# Patient Record
Sex: Female | Born: 1975 | Race: White | Hispanic: No | Marital: Married | Smoking: Former smoker
Health system: Southern US, Community
[De-identification: ages and names within clinical notes are randomized; demographics above are authoritative.]

## PROBLEM LIST (undated history)

## (undated) DIAGNOSIS — F419 Anxiety disorder, unspecified: Secondary | ICD-10-CM

## (undated) HISTORY — PX: KNEE ARTHROSCOPY WITH ANTERIOR CRUCIATE LIGAMENT (ACL) REPAIR: SHX5644

## (undated) HISTORY — DX: Anxiety disorder, unspecified: F41.9

## (undated) HISTORY — PX: FEMORAL HERNIA REPAIR: SUR1179

---

## 2012-07-29 ENCOUNTER — Telehealth: Payer: Self-pay | Admitting: *Deleted

## 2012-07-29 DIAGNOSIS — Z0000 Encounter for general adult medical examination without abnormal findings: Secondary | ICD-10-CM

## 2012-07-29 NOTE — Telephone Encounter (Signed)
LAB ORDERS ENTERED- APPT FOR LAB 5/1 AND MMM APPT 5/8

## 2012-08-05 ENCOUNTER — Other Ambulatory Visit (INDEPENDENT_AMBULATORY_CARE_PROVIDER_SITE_OTHER): Payer: BC Managed Care – PPO

## 2012-08-05 DIAGNOSIS — Z0000 Encounter for general adult medical examination without abnormal findings: Secondary | ICD-10-CM

## 2012-08-05 LAB — COMPLETE METABOLIC PANEL WITH GFR
BUN: 11 mg/dL (ref 6–23)
CO2: 25 mEq/L (ref 19–32)
Creat: 0.87 mg/dL (ref 0.50–1.10)
GFR, Est African American: >89 mL/min
GFR, Est Non African American: 85 mL/min
Glucose, Bld: 96 mg/dL (ref 70–99)
Total Bilirubin: 0.8 mg/dL (ref 0.3–1.2)

## 2012-08-05 NOTE — Addendum Note (Signed)
Addended by: Lisbeth Ply C on: 08/05/2012 10:36 AM   Modules accepted: Orders

## 2012-08-05 NOTE — Progress Notes (Signed)
Patient came in for labs only.

## 2012-08-06 LAB — NMR LIPOPROFILE WITH LIPIDS
Cholesterol, Total: 144 mg/dL (ref <200)
HDL Particle Number: 34.1 umol/L (ref >=30.5)
HDL Size: 9.4 nm (ref >=9.2)
HDL-C: 59 mg/dL (ref >=40)
LDL (calc): 75 mg/dL (ref <100)
LDL Particle Number: 797 nmol/L (ref <1000)
LDL Size: 21 nm (ref >20.5)
LP-IR Score: <25 (ref <=45)
Large HDL-P: 9.2 umol/L (ref >=4.8)
Large VLDL-P: <0.8 nmol/L (ref <=2.7)
Small LDL Particle Number: 228 nmol/L (ref <=527)
Triglycerides: 51 mg/dL (ref <150)
VLDL Size: 40.2 nm (ref <=46.6)

## 2012-08-10 ENCOUNTER — Ambulatory Visit (INDEPENDENT_AMBULATORY_CARE_PROVIDER_SITE_OTHER): Payer: BC Managed Care – PPO | Admitting: *Deleted

## 2012-08-10 DIAGNOSIS — Z111 Encounter for screening for respiratory tuberculosis: Secondary | ICD-10-CM

## 2012-08-12 ENCOUNTER — Encounter: Payer: Self-pay | Admitting: Nurse Practitioner

## 2012-08-12 ENCOUNTER — Ambulatory Visit (INDEPENDENT_AMBULATORY_CARE_PROVIDER_SITE_OTHER): Payer: BC Managed Care – PPO | Admitting: Nurse Practitioner

## 2012-08-12 VITALS — BP 118/74 | HR 65 | Temp 97.5°F | Ht 66.0 in | Wt 143.0 lb

## 2012-08-12 DIAGNOSIS — Z0000 Encounter for general adult medical examination without abnormal findings: Secondary | ICD-10-CM

## 2012-08-12 NOTE — Patient Instructions (Signed)

## 2012-08-12 NOTE — Progress Notes (Signed)
  Subjective:    Patient ID: Marie Baxter, female    DOB: [birth year], 37 y.o.   MRN: [id number]  HPI - Patient here today for CPE for work. She is doing well. Has no medical problems and takes no medicines.    Review of Systems  Constitutional: Negative.   HENT: Negative.   Eyes: Negative.   Respiratory: Negative.   Cardiovascular: Negative.   Gastrointestinal: Negative.   Endocrine: Negative.   Genitourinary: Negative.   Musculoskeletal: Negative.   Allergic/Immunologic: Negative.   Neurological: Negative.   Hematological: Negative.   Psychiatric/Behavioral: Negative.        Objective:   Physical Exam  Constitutional: She is oriented to person, place, and time. She appears well-developed and well-nourished.  HENT:  Nose: Nose normal.  Mouth/Throat: Oropharynx is clear and moist.  Eyes: EOM are normal.  Neck: Trachea normal, normal range of motion and full passive range of motion without pain. Neck supple. No JVD present. Carotid bruit is not present. No thyromegaly present.  Cardiovascular: Normal rate, regular rhythm, normal heart sounds and intact distal pulses.  Exam reveals no gallop and no friction rub.   No murmur heard. Pulmonary/Chest: Effort normal and breath sounds normal.  Abdominal: Soft. Bowel sounds are normal. She exhibits no distension and no mass. There is no tenderness.  Musculoskeletal: Normal range of motion.  Lymphadenopathy:    She has no cervical adenopathy.  Neurological: She is alert and oriented to person, place, and time. She has normal reflexes.  Skin: Skin is warm and dry.  Psychiatric: She has a normal mood and affect. Her behavior is normal. Judgment and thought content normal.          Assessment & Plan:

## 2017-07-21 ENCOUNTER — Ambulatory Visit: Payer: Medicaid Other | Admitting: Family Medicine

## 2017-07-21 ENCOUNTER — Encounter: Payer: Self-pay | Admitting: Family Medicine

## 2017-07-21 VITALS — BP 99/58 | HR 58 | Temp 97.9°F | Ht 66.0 in | Wt 148.0 lb

## 2017-07-21 DIAGNOSIS — Z13220 Encounter for screening for lipoid disorders: Secondary | ICD-10-CM

## 2017-07-21 DIAGNOSIS — M19079 Primary osteoarthritis, unspecified ankle and foot: Secondary | ICD-10-CM

## 2017-07-21 DIAGNOSIS — Z13228 Encounter for screening for other metabolic disorders: Secondary | ICD-10-CM

## 2017-07-21 DIAGNOSIS — Z7689 Persons encountering health services in other specified circumstances: Secondary | ICD-10-CM | POA: Diagnosis not present

## 2017-07-21 DIAGNOSIS — Z130 Encounter for screening for diseases of the blood and blood-forming organs and certain disorders involving the immune mechanism: Secondary | ICD-10-CM

## 2017-07-21 DIAGNOSIS — F419 Anxiety disorder, unspecified: Secondary | ICD-10-CM

## 2017-07-21 NOTE — Progress Notes (Signed)
Subjective: AJ:OINOMVEHM care, joint pain HPI: Marie Baxter is a 42 y.o. female presenting to clinic today for:  1. Joint pain Patient reports that she has been having some pain in the great toe joint of the right foot.  She was wondering if perhaps this was related to poor kidney function and may be was gout.  Denies any increased warmth, numbness or tingling.  No preceding injury that she can recall but she has dropped objects on her feet before.  She has been using ibuprofen for any discomfort.  This has resulted in moderate relief of joint pain.  She notes that she sometimes "drinks more than she should".  Sometimes, she reports that she will drink about 6 beers on the weekend and knows that this is not good for her.   2.  Anxiety Patient reports a history of anxiety, diagnosed about 3 years ago.  She states that much of it started when her mother moved into the home next to her.  She is currently prescribed Lexapro 10 mg, which she takes daily.  Lexapro was prescribed by her OB/GYN.  Tolerating medication without difficulty.  No concerns at this time.  3.  Preventative care Patient notes that she has never had a mammogram before.  There is a family history of breast cancer in her maternal grandmother and her maternal aunt, who she thinks was diagnosed around age 62.  She also notes that her mother had some type of gynecologic surgery for an unknown gynecologic cancer.  Patient reports a personal history of having her bilateral fallopian tubes removed to reduce her risk of developing cancer.  She notes that she gets her Pap smears with Dr. Adah Perl, her OB/GYN.  She has an appointment scheduled next month to have this done.  She has had a test for HIV in the past and this was negative.  She also had her tetanus shot during pregnancy.  She will have these records sent to our office.  Past Medical History:  Diagnosis Date  . Anxiety    Past Surgical History:  Procedure Laterality Date    . CESAREAN SECTION  2011  . CESAREAN SECTION  2015  . FEMORAL HERNIA REPAIR Left   . KNEE ARTHROSCOPY WITH ANTERIOR CRUCIATE LIGAMENT (ACL) REPAIR     Social History   Socioeconomic History  . Marital status: Married    Spouse name: Not on file  . Number of children: 2  . Years of education: Not on file  . Highest education level: Not on file  Occupational History  . 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  Social Needs  . Financial resource strain: Not on file  . Food insecurity:    Worry: Not on file    Inability: Not on file  . Transportation needs:    Medical: Not on file    Non-medical: Not on file  Tobacco Use  . Smoking status: Former Smoker    Types: Cigarettes  . Smokeless tobacco: Never Used  Substance and Sexual Activity  . Alcohol use: Yes    Alcohol/week: 3.6 oz    Types: 6 Cans of beer per week    Comment: maybe twice per month  . Drug use: No  . Sexual activity: Yes    Birth control/protection: Surgical  Lifestyle  . Physical activity:    Days per week: Not on file    Minutes per session: Not on file  . Stress: Not on file  Relationships  . Social connections:    Talks on phone: Not on file    Gets together: Not on file    Attends religious service: Not on file    Active member of club or organization: Not on file    Attends meetings of clubs or organizations: Not on file    Relationship status: Not on file  . Intimate partner violence:    Fear of current or ex partner: Not on file    Emotionally abused: Not on file    Physically abused: Not on file    Forced sexual activity: Not on file  Other Topics Concern  . Not on file  Social History Narrative  . Not on file   Current Meds  Medication Sig  . escitalopram (LEXAPRO) 20 MG tablet Take 10 mg by mouth daily.   Family History  Problem Relation Age of Onset  . COPD Mother   . Cancer Mother        unknown GYN cancer  . Stroke Mother 78  . Cancer Maternal Aunt         breast cancer  . Breast cancer Maternal Aunt   . Cancer Maternal Grandmother        breast cancer  . COPD Maternal Grandmother   . Breast cancer Maternal Grandmother   . Cancer Father        oral  . Alcohol abuse Father   . Healthy Sister   . Healthy Son   . Healthy Sister   . Allergic rhinitis Son    No Known Allergies   Health Maintenance: Pap/ Mammo ROS: Per HPI  Objective: Office vital signs reviewed. BP (!) 99/58   Pulse (!) 58   Temp 97.9 F (36.6 C) (Oral)   Ht _0  (1.676 m)   Wt 148 lb (67.1 kg)   BMI 23.89 kg/m   Physical Examination:  General: Awake, alert, well nourished, well appearing female, No acute distress HEENT: Normal    Neck: No masses palpated. No lymphadenopathy    Ears: Tympanic membranes intact, normal light reflex, no erythema, no bulging    Eyes: PERRLA, extraocular movement in tact, sclera white    Nose: nasal turbinates moist, no nasal discharge    Throat: moist mucus membranes, no erythema, no tonsillar exudate.  Airway is patent Cardio: regular rate and rhythm, S1S2 heard, no murmurs appreciated Pulm: clear to auscultation bilaterally, no wheezes, rhonchi or rales; normal work of breathing on room air GI: soft, non-tender, non-distended, bowel sounds present x4, no hepatomegaly, no splenomegaly, no masses Extremities: warm, well perfused, No edema, cyanosis or clubbing; +2 pulses bilaterally  Right great toe: No gross joint swelling or erythema.  No increased warmth.  Patient has full active range of motion of the great toe.  There is no tenderness to palpation.  She does seem to have somewhat of an enlarged joint in this area. MSK: normal gait and normal station Skin: dry; intact; no rashes or lesions Neuro: No focal neurologic deficits.  Follows all commands  Psych: Mood stable, speech normal, affect appropriate, good eye contact Depression screen PHQ 2/9 07/21/2017  Decreased Interest 0  Down, Depressed, Hopeless 0  PHQ - 2  Score 0    Assessment/ Plan: 42 y.o. female   1. Arthritis of metatarsophalangeal (MTP) joint of great toe No evidence of gout flare or infectious arthritis.  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  May continue oral NSAID versus Tylenol arthritis.  If continues  to be an issue, we will plan to obtain x-rays of the foot and possibly refer to orthopedics for intra-articular injections.  For now okay to continue conservative care.  2. Establishing care with new doctor, encounter for I asked that patient have Dr. Adah Perl send [place] office notes with her Pap smear and mammogram results.  I am happy to take over prescribing Lexapro if patient wishes.  2. Screening for metabolic disorder - UNG76+BOMQ  3. Screening for lipid disorders - Lipid Panel  4. Screening for deficiency anemia - CBC with Differential  5. Anxiety Currently controlled with Lexapro 10 mg.  We will continue to monitor and assist as able.  Janora Norlander, DO [place] [phone]

## 2017-07-22 LAB — LIPID PANEL
CHOLESTEROL TOTAL: 177 mg/dL (ref 100–199)
Chol/HDL Ratio: 2.9 ratio (ref 0.0–4.4)
HDL: 62 mg/dL (ref >39)
LDL Calculated: 101 mg/dL — ABNORMAL HIGH (ref 0–99)
TRIGLYCERIDES: 72 mg/dL (ref 0–149)
VLDL Cholesterol Cal: 14 mg/dL (ref 5–40)

## 2017-07-22 LAB — CBC WITH DIFFERENTIAL/PLATELET
BASOS: 2 %
Basophils Absolute: 0.1 10*3/uL (ref 0.0–0.2)
EOS (ABSOLUTE): 0.1 10*3/uL (ref 0.0–0.4)
EOS: 3 %
HEMATOCRIT: 39.9 % (ref 34.0–46.6)
Hemoglobin: 13.2 g/dL (ref 11.1–15.9)
IMMATURE GRANS (ABS): 0 10*3/uL (ref 0.0–0.1)
IMMATURE GRANULOCYTES: 0 %
LYMPHS: 44 %
Lymphocytes Absolute: 1.8 10*3/uL (ref 0.7–3.1)
MCH: 32.8 pg (ref 26.6–33.0)
MCHC: 33.1 g/dL (ref 31.5–35.7)
MCV: 99 fL — AB (ref 79–97)
MONOS ABS: 0.3 10*3/uL (ref 0.1–0.9)
Monocytes: 9 %
NEUTROS PCT: 42 %
Neutrophils Absolute: 1.7 10*3/uL (ref 1.4–7.0)
Platelets: 222 10*3/uL (ref 150–379)
RBC: 4.03 x10E6/uL (ref 3.77–5.28)
RDW: 13.4 % (ref 12.3–15.4)
WBC: 4 10*3/uL (ref 3.4–10.8)

## 2017-07-22 LAB — CMP14+EGFR
ALK PHOS: 45 IU/L (ref 39–117)
ALT: 17 IU/L (ref 0–32)
AST: 20 IU/L (ref 0–40)
Albumin/Globulin Ratio: 2 (ref 1.2–2.2)
Albumin: 4.9 g/dL (ref 3.5–5.5)
BUN/Creatinine Ratio: 10 (ref 9–23)
BUN: 8 mg/dL (ref 6–24)
Bilirubin Total: 0.5 mg/dL (ref 0.0–1.2)
CO2: 26 mmol/L (ref 20–29)
CREATININE: 0.81 mg/dL (ref 0.57–1.00)
Calcium: 9.1 mg/dL (ref 8.7–10.2)
Chloride: 102 mmol/L (ref 96–106)
GFR calc Af Amer: 103 mL/min/{1.73_m2} (ref >59)
GFR calc non Af Amer: 89 mL/min/{1.73_m2} (ref >59)
GLUCOSE: 69 mg/dL (ref 65–99)
Globulin, Total: 2.4 g/dL (ref 1.5–4.5)
Potassium: 4.1 mmol/L (ref 3.5–5.2)
SODIUM: 141 mmol/L (ref 134–144)
Total Protein: 7.3 g/dL (ref 6.0–8.5)

## 2018-07-28 ENCOUNTER — Encounter: Payer: Self-pay | Admitting: Family Medicine

## 2018-07-28 ENCOUNTER — Other Ambulatory Visit: Payer: Self-pay

## 2018-07-28 ENCOUNTER — Ambulatory Visit: Payer: PRIVATE HEALTH INSURANCE | Admitting: Family Medicine

## 2018-07-28 VITALS — BP 120/74 | HR 70 | Temp 98.9°F | Ht 66.0 in | Wt 158.0 lb

## 2018-07-28 DIAGNOSIS — R635 Abnormal weight gain: Secondary | ICD-10-CM

## 2018-07-28 DIAGNOSIS — F419 Anxiety disorder, unspecified: Secondary | ICD-10-CM

## 2018-07-28 DIAGNOSIS — M19079 Primary osteoarthritis, unspecified ankle and foot: Secondary | ICD-10-CM

## 2018-07-28 MED ORDER — ESCITALOPRAM OXALATE 20 MG PO TABS: 10.0000 mg | ORAL_TABLET | Freq: Every day | ORAL | 1 refills | Status: DC

## 2018-07-28 NOTE — Progress Notes (Signed)
 Subjective: CC: GAD, ?gout PCP: Gottschalk, Ashly M, DO HPI:Marie Baxter is a 43 y.o. female presenting to clinic today for:  1. GAD History: Longstanding history of generalized anxiety disorder.  She was previously treated with Lexapro 10 mg daily by her gynecologist.  She is asking that I take over prescriptions for this medicine.  She has been off of it for a while now but notes that anxiety symptoms have come back.  She would like to resume medication.  Additionally, she notes that she was on 20 mg in the past but this induced lethargy.  No SI, HI.  2.  Pain of right great toe joint Patient with known MTP arthritis of the great toe on the right foot.  She notes that the pain seemed to worsen after consumption of alcohol recently and she wanted to know she needed to be checked out for gout.  Overall the pain in her joint is somewhat relieved by scheduled Tylenol arthritis but occasionally she does have worsening symptoms.  Denies any other joint pain or swelling.  No discoloration.  No increased warmth of the great toe.  3.  Weight gain Patient reports over the last month or so she has gained about 4 pounds every 2 weeks.  She wonders if this is because of being cooped up in the house secondary to COVID-19.  She would like to have her thyroid checked.  No tremor, change in voice.   ROS: Per HPI  No Known Allergies Past Medical History:  Diagnosis Date  . Anxiety     Current Outpatient Medications:  .  escitalopram (LEXAPRO) 20 MG tablet, Take 0.5 tablets (10 mg total) by mouth daily., Disp: 90 tablet, Rfl: 1 Social History   Socioeconomic History  . Marital status: Married    Spouse name: Not on file  . Number of children: 2  . Years of education: Not on file  . Highest education level: Not on file  Occupational History  . 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  Social Needs  . Financial resource strain: Not on file  . Food insecurity:     Worry: Not on file    Inability: Not on file  . Transportation needs:    Medical: Not on file    Non-medical: Not on file  Tobacco Use  . Smoking status: Former Smoker    Types: Cigarettes  . Smokeless tobacco: Never Used  Substance and Sexual Activity  . Alcohol use: Yes    Alcohol/week: 6.0 standard drinks    Types: 6 Cans of beer per week    Comment: maybe twice per month  . Drug use: No  . Sexual activity: Yes    Birth control/protection: Surgical  Lifestyle  . Physical activity:    Days per week: Not on file    Minutes per session: Not on file  . Stress: Not on file  Relationships  . Social connections:    Talks on phone: Not on file    Gets together: Not on file    Attends religious service: Not on file    Active member of club or organization: Not on file    Attends meetings of clubs or organizations: Not on file    Relationship status: Not on file  . Intimate partner violence:    Fear of current or ex partner: Not on file    Emotionally abused: Not on file    Physically abused: Not on file      Forced sexual activity: Not on file  Other Topics Concern  . Not on file  Social History Narrative  . Not on file   Family History  Problem Relation Age of Onset  . COPD Mother   . Cancer Mother        unknown GYN cancer  . Stroke Mother 35  . Cancer Maternal Aunt        breast cancer  . Breast cancer Maternal Aunt   . Cancer Maternal Grandmother        breast cancer  . COPD Maternal Grandmother   . Breast cancer Maternal Grandmother   . Cancer Father        oral  . Alcohol abuse Father   . Healthy Sister   . Healthy Son   . Healthy Sister   . Allergic rhinitis Son     Objective: Office vital signs reviewed. BP 120/74   Pulse 70   Temp 98.9 F (37.2 C) (Oral)   Ht 5' 6" (1.676 m)   Wt 158 lb (71.7 kg)   BMI 25.50 kg/m   Physical Examination:  General: Awake, alert, well nourished, No acute distress HEENT: Normal    Neck: No masses palpated. No  lymphadenopathy; no thyromegaly or palpable thyroid nodules    Ears: Tympanic membranes intact, normal light reflex, no erythema, no bulging    Eyes: No exophthalmos, sclera white Cardio: regular rate and rhythm, S1S2 heard, no murmurs appreciated Pulm: clear to auscultation bilaterally, no wheezes, rhonchi or rales; normal work of breathing on room air Extremities: warm, well perfused, No edema, cyanosis or clubbing; +2 pulses bilaterally MSK: Normal gait and normal station  Right foot: She has visible arthritic changes, particular along the medial aspect of the right MTP.  There is no appreciable increased warmth, discoloration or joint swelling. Skin: dry; intact; no rashes or lesions; normal temperature Neuro: Patellar DTRs 3/4 bilaterally Psych: Mood stable, speech normal, affect appropriate, pleasant and interactive. Depression screen [facility] 2/9 07/28/2018 07/21/2017  Decreased Interest 1 0  Down, Depressed, Hopeless 0 0  PHQ - 2 Score 1 0  Altered sleeping 1 -  Tired, decreased energy 1 -  Change in appetite 1 -  Feeling bad or failure about yourself  2 -  Trouble concentrating 1 -  Moving slowly or fidgety/restless 1 -  Suicidal thoughts 0 -  PHQ-9 Score 8 -  Difficult doing work/chores Somewhat difficult -   GAD 7 : Generalized Anxiety Score 07/28/2018  Nervous, Anxious, on Edge 2  Control/stop worrying 2  Worry too much - different things 2  Trouble relaxing 2  Restless 2  Easily annoyed or irritable 3  Afraid - awful might happen 1  Total GAD 7 Score 14   Assessment/ Plan: 43 y.o. female   1. Anxiety Likely exacerbated by the fact that she has been off of medications.  I will start her back on the Lexapro 10 mg daily.  She prefers a 20 mg tablet so that she can break them in half and have a longer supply.  We discussed that if anxiety continue to be uncontrolled we can consider adding something like buspirone.  Would hesitate to add something like Atarax or a  benzodiazepine in this patient who had lethargy related to increased dose of Lexapro.  Check thyroid panel, CMP. - escitalopram (LEXAPRO) 20 MG tablet; Take 0.5 tablets (10 mg total) by mouth daily.  Dispense: 90 tablet; Refill: 1 - CMP14+EGFR - TSH  2. Arthritis of metatarsophalangeal (  MTP) joint of great toe Doubt gout at this point but given the fact that symptoms seem worse with alcohol we will check uric acid.  Check CBC and CMP.  I have given her handout on low purine diet for ongoing reference - CMP14+EGFR - CBC - Uric Acid  3. Unexplained weight gain Likely related to the state home order.  We will check thyroid panel to rule out metabolic etiology.  I encouraged her to maintain a healthy diet and stay physically active.  Lipid panel is nonfasting. - CMP14+EGFR - TSH - Lipid Panel   Orders Placed This Encounter  Procedures  . CMP14+EGFR  . CBC  . TSH  . Lipid Panel  . Uric Acid   Meds ordered this encounter  Medications  . escitalopram (LEXAPRO) 20 MG tablet    Sig: Take 0.5 tablets (10 mg total) by mouth daily.    Dispense:  90 tablet    Refill:  1     Ashly M Gottschalk, DO [facility] [phone]   

## 2018-07-28 NOTE — Patient Instructions (Signed)
You had labs performed today.  You will be contacted with the results of the labs once they are available, usually in the next 3 business days for routine lab work.  If you had a pap smear or biopsy performed, expect to be contacted in about 7-10 days.  We discussed using Naproxen for pain (you can take 2 over the counter at a time if needed.  Take with food and SEPARATELY from your lexapro.  We discussed medications that we can add to Lexapro if needed for ongoing anxiety.  Give yourself about 6-8 weeks on Lexapro before we decide if additional medication is needed.  Buspar is the medication we would add next.  Make sure to have your pap smear results sent to me.   Low-Purine Eating Plan A low-purine eating plan involves making food choices to limit your intake of purine. Purine is a kind of uric acid. Too much uric acid in your blood can cause certain conditions, such as gout and kidney stones. Eating a low-purine diet can help control these conditions. What are tips for following this plan? Reading food labels   Avoid foods with saturated or Trans fat.  Check the ingredient list of grains-based foods, such as bread and cereal, to make sure that they contain whole grains.  Check the ingredient list of sauces or soups to make sure they do not contain meat or fish.  When choosing soft drinks, check the ingredient list to make sure they do not contain high-fructose corn syrup. Shopping  Buy plenty of fresh fruits and vegetables.  Avoid buying canned or fresh fish.  Buy dairy products labeled as low-fat or nonfat.  Avoid buying premade or processed foods. These foods are often high in fat, salt (sodium), and added sugar. Cooking  Use olive oil instead of butter when cooking. Oils like olive oil, canola oil, and sunflower oil contain healthy fats. Meal planning  Learn which foods do or do not affect you. If you find out that a food tends to cause your gout symptoms to flare up,  avoid eating that food. You can enjoy foods that do not cause problems. If you have any questions about a food item, talk with your dietitian or health care provider.  Limit foods high in fat, especially saturated fat. Fat makes it harder for your body to get rid of uric acid.  Choose foods that are lower in fat and are lean sources of protein. General guidelines  Limit alcohol intake to no more than 1 drink a day for nonpregnant women and 2 drinks a day for men. One drink equals 12 oz of beer, 5 oz of wine, or 1 oz of hard liquor. Alcohol can affect the way your body gets rid of uric acid.  Drink plenty of water to keep your urine clear or pale yellow. Fluids can help remove uric acid from your body.  If directed by your health care provider, take a vitamin C supplement.  Work with your health care provider and dietitian to develop a plan to achieve or maintain a healthy weight. Losing weight can help reduce uric acid in your blood. What foods are recommended? The items listed may not be a complete list. Talk with your dietitian about what dietary choices are best for you. Foods low in purines Foods low in purines do not need to be limited. These include:  All fruits.  All low-purine vegetables, pickles, and olives.  Breads, pasta, rice, cornbread, and popcorn. Cake and other baked  goods.  All dairy foods.  Eggs, nuts, and nut butters.  Spices and condiments, such as salt, herbs, and vinegar.  Plant oils, butter, and margarine.  Water, sugar-free soft drinks, tea, coffee, and cocoa.  Vegetable-based soups, broths, sauces, and gravies. Foods moderate in purines Foods moderate in purines should be limited to the amounts listed.   cup of asparagus, cauliflower, spinach, mushrooms, or green peas, each day.  2/3 cup uncooked oatmeal, each day.   cup dry wheat bran or wheat germ, each day.  2-3 ounces of meat or poultry, each day.  4-6 ounces of shellfish, such as crab,  lobster, oysters, or shrimp, each day.  1 cup cooked beans, peas, or lentils, each day.  Soup, broths, or bouillon made from meat or fish. Limit these foods as much as possible. What foods are not recommended? The items listed may not be a complete list. Talk with your dietitian about what dietary choices are best for you. Limit your intake of foods high in purines, including:  Beer and other alcohol.  Meat-based gravy or sauce.  Canned or fresh fish, such as: ? Anchovies, sardines, herring, and tuna. ? Mussels and scallops. ? Codfish, trout, and haddock.  Berniece Salines.  Organ meats, such as: ? Liver or kidney. ? Tripe. ? Sweetbreads (thymus gland or pancreas).  Wild Clinical biochemist.  Yeast or yeast extract supplements.  Drinks sweetened with high-fructose corn syrup. Summary  Eating a low-purine diet can help control conditions caused by too much uric acid in the body, such as gout or kidney stones.  Choose low-purine foods, limit alcohol, and limit foods high in fat.  You will learn over time which foods do or do not affect you. If you find out that a food tends to cause your gout symptoms to flare up, avoid eating that food. This information is not intended to replace advice given to you by your health care provider. Make sure you discuss any questions you have with your health care provider. Document Released: 07/19/2010 Document Revised: 05/07/2016 Document Reviewed: 05/07/2016 Elsevier Interactive Patient Education  2019 [employer].

## 2018-07-29 LAB — CBC
Hematocrit: 38.3 % (ref 34.0–46.6)
Hemoglobin: 13 g/dL (ref 11.1–15.9)
MCH: 32.8 pg (ref 26.6–33.0)
MCHC: 33.9 g/dL (ref 31.5–35.7)
MCV: 97 fL (ref 79–97)
Platelets: 207 10*3/uL (ref 150–450)
RBC: 3.96 x10E6/uL (ref 3.77–5.28)
RDW: 12.1 % (ref 11.7–15.4)
WBC: 4.5 10*3/uL (ref 3.4–10.8)

## 2018-07-29 LAB — CMP14+EGFR
ALT: 14 IU/L (ref 0–32)
AST: 18 IU/L (ref 0–40)
Albumin/Globulin Ratio: 2 (ref 1.2–2.2)
Albumin: 4.5 g/dL (ref 3.8–4.8)
Alkaline Phosphatase: 46 IU/L (ref 39–117)
BUN/Creatinine Ratio: 13 (ref 9–23)
BUN: 10 mg/dL (ref 6–24)
Bilirubin Total: 0.6 mg/dL (ref 0.0–1.2)
CO2: 25 mmol/L (ref 20–29)
Calcium: 9.2 mg/dL (ref 8.7–10.2)
Chloride: 101 mmol/L (ref 96–106)
Creatinine, Ser: 0.76 mg/dL (ref 0.57–1.00)
GFR calc Af Amer: 110 mL/min/{1.73_m2} (ref >59)
GFR calc non Af Amer: 96 mL/min/{1.73_m2} (ref >59)
Globulin, Total: 2.2 g/dL (ref 1.5–4.5)
Glucose: 71 mg/dL (ref 65–99)
Potassium: 4.6 mmol/L (ref 3.5–5.2)
Sodium: 138 mmol/L (ref 134–144)
Total Protein: 6.7 g/dL (ref 6.0–8.5)

## 2018-07-29 LAB — LIPID PANEL
Chol/HDL Ratio: 3.6 ratio (ref 0.0–4.4)
Cholesterol, Total: 218 mg/dL — ABNORMAL HIGH (ref 100–199)
HDL: 61 mg/dL (ref >39)
LDL Calculated: 130 mg/dL — ABNORMAL HIGH (ref 0–99)
Triglycerides: 136 mg/dL (ref 0–149)
VLDL Cholesterol Cal: 27 mg/dL (ref 5–40)

## 2018-07-29 LAB — TSH: TSH: 1.23 u[IU]/mL (ref 0.450–4.500)

## 2018-07-29 LAB — URIC ACID: Uric Acid: 3.1 mg/dL (ref 2.5–7.1)

## 2019-07-21 ENCOUNTER — Other Ambulatory Visit: Payer: Self-pay | Admitting: Family Medicine

## 2019-07-21 DIAGNOSIS — F419 Anxiety disorder, unspecified: Secondary | ICD-10-CM

## 2019-08-09 ENCOUNTER — Other Ambulatory Visit: Payer: Self-pay | Admitting: Family Medicine

## 2019-08-09 DIAGNOSIS — F419 Anxiety disorder, unspecified: Secondary | ICD-10-CM

## 2019-08-10 NOTE — Telephone Encounter (Signed)
Gottschalk. NTBS 30 days given 07/21/19 LOV 07/28/18

## 2019-08-11 MED ORDER — ESCITALOPRAM OXALATE 20 MG PO TABS: 20.0000 mg | ORAL_TABLET | Freq: Every day | ORAL | 0 refills | Status: DC

## 2019-08-11 NOTE — Telephone Encounter (Signed)
Patient made fist available with provider.

## 2019-08-11 NOTE — Addendum Note (Signed)
Addended by: Brynda Peon F on: 08/11/2019 10:46 AM   Modules accepted: Orders

## 2019-09-12 ENCOUNTER — Ambulatory Visit: Payer: Self-pay | Attending: Internal Medicine

## 2019-09-12 ENCOUNTER — Ambulatory Visit: Payer: PRIVATE HEALTH INSURANCE | Admitting: Family Medicine

## 2019-09-12 DIAGNOSIS — Z23 Encounter for immunization: Secondary | ICD-10-CM

## 2019-09-12 NOTE — Progress Notes (Signed)
   Covid-19 Vaccination Clinic  Name:  Marie Baxter    MRN: [id number] DOB: [birth year]  09/12/2019  Marie Baxter was observed post Covid-19 immunization for 15 minutes without incident. She was provided with Vaccine Information Sheet and instruction to access the V-Safe system.   Marie Baxter was instructed to call 911 with any severe reactions post vaccine: Marland Kitchen Difficulty breathing  . Swelling of face and throat  . A fast heartbeat  . A bad rash all over body  . Dizziness and weakness   Immunizations Administered    Name Date Dose VIS Date Route   JANSSEN COVID-19 VACCINE 09/12/2019  3:15 PM 0.5 mL 06/04/2019 Intramuscular   Manufacturer: Alphonsa Overall   Lot: [id number]   [place]: [id number]

## 2019-10-04 ENCOUNTER — Other Ambulatory Visit: Payer: Self-pay

## 2019-10-04 ENCOUNTER — Encounter: Payer: Self-pay | Admitting: Family Medicine

## 2019-10-04 ENCOUNTER — Ambulatory Visit (INDEPENDENT_AMBULATORY_CARE_PROVIDER_SITE_OTHER): Payer: Self-pay | Admitting: Family Medicine

## 2019-10-04 VITALS — BP 135/94 | HR 75 | Temp 97.9°F | Ht 66.0 in | Wt 152.0 lb

## 2019-10-04 DIAGNOSIS — Z638 Other specified problems related to primary support group: Secondary | ICD-10-CM

## 2019-10-04 DIAGNOSIS — F419 Anxiety disorder, unspecified: Secondary | ICD-10-CM

## 2019-10-04 MED ORDER — ESCITALOPRAM OXALATE 20 MG PO TABS: 20.0000 mg | ORAL_TABLET | Freq: Every day | ORAL | 3 refills | Status: DC

## 2019-10-04 NOTE — Progress Notes (Signed)
Subjective: CC: GAD follow up PCP: Marie Norlander, DO Marie Baxter is a 44 y.o. female presenting to clinic today for:  1. GAD History: Longstanding history of generalized anxiety disorder.  She was previously treated with Lexapro 10 mg daily by her gynecologist.    She has that she been fairly stable on the Lexapro 20 mg but that she ran out about 2 weeks ago and recently went on an obligatory vacation with her in-laws.  There was some poor treatment of her son and this caused strain with her in-laws.  She is still quite agitated about the events that occurred at the beach.  She attributes much of her moodiness to the lapse in medication and would certainly like to go back on the medication.  She does not report any adverse side effects of the medicine.  ROS: Per HPI  No Known Allergies Past Medical History:  Diagnosis Date  . Anxiety     Current Outpatient Medications:  .  escitalopram (LEXAPRO) 20 MG tablet, Take 1 tablet (20 mg total) by mouth daily. (Needs to be seen before next refill), Disp: 30 tablet, Rfl: 0 Social History   Socioeconomic History  . Marital status: Married    Spouse name: Not on file  . Number of children: 2  . Years of education: Not on file  . Highest education level: Not on file  Occupational History  . 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  Tobacco Use  . Smoking status: Former Smoker    Types: Cigarettes  . Smokeless tobacco: Never Used  Vaping Use  . Vaping Use: Never used  Substance and Sexual Activity  . Alcohol use: Yes    Alcohol/week: 6.0 standard drinks    Types: 6 Cans of beer per week    Comment: maybe twice per month  . Drug use: No  . Sexual activity: Yes    Birth control/protection: Surgical  Other Topics Concern  . Not on file  Social History Narrative  . Not on file   Social Determinants of Health   Financial Resource Strain:   . Difficulty of Paying Living Expenses:   Food  Insecurity:   . Worried About Charity fundraiser in the Last Year:   . Arboriculturist in the Last Year:   Transportation Needs:   . Film/video editor (Medical):   Marland Kitchen Lack of Transportation (Non-Medical):   Physical Activity:   . Days of Exercise per Week:   . Minutes of Exercise per Session:   Stress:   . Feeling of Stress :   Social Connections:   . Frequency of Communication with Friends and Family:   . Frequency of Social Gatherings with Friends and Family:   . Attends Religious Services:   . Active Member of Clubs or Organizations:   . Attends Archivist Meetings:   Marland Kitchen Marital Status:   Intimate Partner Violence:   . Fear of Current or Ex-Partner:   . Emotionally Abused:   Marland Kitchen Physically Abused:   . Sexually Abused:    Family History  Problem Relation Age of Onset  . COPD Mother   . Cancer Mother        unknown GYN cancer  . Stroke Mother 15  . Cancer Maternal Aunt        breast cancer  . Breast cancer Maternal Aunt   . Cancer Maternal Grandmother        breast  cancer  . COPD Maternal Grandmother   . Breast cancer Maternal Grandmother   . Cancer Father        oral  . Alcohol abuse Father   . Healthy Sister   . Healthy Son   . Healthy Sister   . Allergic rhinitis Son     Objective: Office vital signs reviewed. BP (!) 135/94   Pulse 75   Temp 97.9 F (36.6 C)   Ht 5\' 6"  (1.676 m)   Wt 152 lb (68.9 kg)   SpO2 98%   BMI 24.53 kg/m   Physical Examination:  General: Awake, alert, well nourished, No acute distress HEENT: Normal; no LAD Cardio: regular rate and rhythm, S1S2 heard, no murmurs appreciated Pulm: clear to auscultation bilaterally, no wheezes, rhonchi or rales; normal work of breathing on room air Psych: Mood somewhat agitated but patient is pleasant, speech normal, affect appropriate Depression screen [facility] 2/9 10/04/2019 07/28/2018 07/21/2017  Decreased Interest 0 1 0  Down, Depressed, Hopeless 0 0 0  PHQ - 2 Score 0 1 0  Altered  sleeping 0 1 -  Tired, decreased energy 0 1 -  Change in appetite 0 1 -  Feeling bad or failure about yourself  0 2 -  Trouble concentrating 0 1 -  Moving slowly or fidgety/restless 0 1 -  Suicidal thoughts 0 0 -  PHQ-9 Score 0 8 -  Difficult doing work/chores - Somewhat difficult -   GAD 7 : Generalized Anxiety Score 10/04/2019 07/28/2018  Nervous, Anxious, on Edge 2 2  Control/stop worrying 1 2  Worry too much - different things 1 2  Trouble relaxing 1 2  Restless 2 2  Easily annoyed or irritable 3 3  Afraid - awful might happen 0 1  Total GAD 7 Score 10 14  Anxiety Difficulty Very difficult -   Assessment/ Plan: 44 y.o. female   1. Anxiety I think the exacerbation her symptoms are certainly due to recent situation and potentially lapse in medication.  I have gone ahead and renewed her medication and would like to see her back at her convenience for her physical and Pap smear she is due for these.  I have encouraged her to contact me should she ever lapse of the medication going forward and we can certainly at least bridge her until she is seen in the office.  She voiced good understanding.  She has good support from family.  She will follow-up as needed as directed - escitalopram (LEXAPRO) 20 MG tablet; Take 1 tablet (20 mg total) by mouth daily.  Dispense: 90 tablet; Refill: 3  2. Stress due to family tension   No orders of the defined types were placed in this encounter.  No orders of the defined types were placed in this encounter.    Marie Norlander, DO [place] [phone]

## 2019-12-01 ENCOUNTER — Encounter: Payer: Self-pay | Admitting: Family Medicine

## 2020-05-22 ENCOUNTER — Encounter: Payer: Self-pay | Admitting: Family Medicine

## 2020-05-22 ENCOUNTER — Other Ambulatory Visit (HOSPITAL_COMMUNITY)
Admission: RE | Admit: 2020-05-22 | Discharge: 2020-05-22 | Disposition: A | Discharge status: Home / Self Care | Payer: Medicaid Other | Source: Ambulatory Visit | Attending: Family Medicine | Admitting: Family Medicine

## 2020-05-22 ENCOUNTER — Ambulatory Visit (INDEPENDENT_AMBULATORY_CARE_PROVIDER_SITE_OTHER): Payer: Medicaid Other | Admitting: Family Medicine

## 2020-05-22 ENCOUNTER — Other Ambulatory Visit: Payer: Self-pay

## 2020-05-22 VITALS — BP 112/72 | HR 68 | Temp 97.8°F | Ht 66.0 in | Wt 158.0 lb

## 2020-05-22 DIAGNOSIS — Z124 Encounter for screening for malignant neoplasm of cervix: Secondary | ICD-10-CM

## 2020-05-22 DIAGNOSIS — Z01419 Encounter for gynecological examination (general) (routine) without abnormal findings: Secondary | ICD-10-CM | POA: Diagnosis not present

## 2020-05-22 DIAGNOSIS — Z130 Encounter for screening for diseases of the blood and blood-forming organs and certain disorders involving the immune mechanism: Secondary | ICD-10-CM

## 2020-05-22 DIAGNOSIS — D229 Melanocytic nevi, unspecified: Secondary | ICD-10-CM

## 2020-05-22 DIAGNOSIS — F419 Anxiety disorder, unspecified: Secondary | ICD-10-CM

## 2020-05-22 DIAGNOSIS — E782 Mixed hyperlipidemia: Secondary | ICD-10-CM

## 2020-05-22 MED ORDER — ESCITALOPRAM OXALATE 20 MG PO TABS: 20.0000 mg | ORAL_TABLET | Freq: Every day | ORAL | 3 refills | Status: AC

## 2020-05-22 NOTE — Progress Notes (Signed)
Marie Baxter is a 45 y.o. female presents to office today for annual physical exam examination.    Concerns today include: 1.  Anxiety disorder She is compliant with Lexapro 20 mg daily.  She is currently seeing Rosine Beat weekly for counseling services.  She is been under her care for about a month now.  She admits that she was having issues with alcohol disorder and did not really realize it was an issue until she tried to abruptly discontinue.  She has "momma trauma".  And she is trying to work through this.  Sometimes her anxiety manifests as heart palpitations and chest tightness.  She identifies these as panic attacks.  Marital status: married, Substance use: former ETOH Diet: fair  Last dental exam: UTD Last colonoscopy: needs Last mammogram: needs, never had.  Maternal aunt with history of breast cancer in her 30s Last pap smear: needs Refills needed today: Lexapro Immunizations needed:  Immunization History  Administered Date(s) Administered  . Influenza,inj,Quad PF,6+ Mos 02/06/2020  . Janssen (J&J) SARS-COV-2 Vaccination 09/12/2019  . MMR 05/01/2009  . Moderna Sars-Covid-2 Vaccination 03/27/2020  . PPD Test 08/10/2012  . Tdap 05/01/2009, 12/10/2013, 07/22/2019     Past Medical History:  Diagnosis Date  . Anxiety    Social History   Socioeconomic History  . Marital status: Married    Spouse name: Not on file  . Number of children: 2  . Years of education: Not on file  . Highest education level: Not on file  Occupational History  . 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  Tobacco Use  . Smoking status: Former Smoker    Types: Cigarettes  . Smokeless tobacco: Never Used  Vaping Use  . Vaping Use: Never used  Substance and Sexual Activity  . Alcohol use: Yes    Alcohol/week: 6.0 standard drinks    Types: 6 Cans of beer per week    Comment: maybe twice per month  . Drug use: No  . Sexual activity: Yes    Birth  control/protection: Surgical  Other Topics Concern  . Not on file  Social History Narrative  . Not on file   Social Determinants of Health   Financial Resource Strain: Not on file  Food Insecurity: Not on file  Transportation Needs: Not on file  Physical Activity: Not on file  Stress: Not on file  Social Connections: Not on file  Intimate Partner Violence: Not on file   Past Surgical History:  Procedure Laterality Date  . CESAREAN SECTION  2011  . CESAREAN SECTION  2015  . FEMORAL HERNIA REPAIR Left   . KNEE ARTHROSCOPY WITH ANTERIOR CRUCIATE LIGAMENT (ACL) REPAIR     Family History  Problem Relation Age of Onset  . COPD Mother   . Cancer Mother        unknown GYN cancer  . Stroke Mother 14  . Cancer Maternal Aunt        breast cancer  . Breast cancer Maternal Aunt   . Cancer Maternal Grandmother        breast cancer  . COPD Maternal Grandmother   . Breast cancer Maternal Grandmother   . Cancer Father        oral  . Alcohol abuse Father   . Healthy Sister   . Healthy Son   . Healthy Sister   . Allergic rhinitis Son     Current Outpatient Medications:  .  escitalopram (LEXAPRO) 20 MG tablet,  Take 1 tablet (20 mg total) by mouth daily., Disp: 90 tablet, Rfl: 3  No Known Allergies   ROS: Review of Systems Pertinent items noted in HPI and remainder of comprehensive ROS otherwise negative.    Physical exam BP 112/72   Pulse 68   Temp 97.8 F (36.6 C) (Temporal)   Ht 5' 6"  (1.676 m)   Wt 158 lb (71.7 kg)   SpO2 98%   BMI 25.50 kg/m  General appearance: alert, cooperative, appears stated age and no distress Head: Normocephalic, without obvious abnormality, atraumatic Eyes: negative findings: lids and lashes normal, conjunctivae and sclerae normal, corneas clear and pupils equal, round, reactive to light and accomodation Ears: normal TM's and external ear canals both ears Nose: Nares normal. Septum midline. Mucosa normal. No drainage or sinus  tenderness. Throat: lips, mucosa, and tongue normal; teeth and gums normal Neck: no adenopathy, no carotid bruit, supple, symmetrical, trachea midline and thyroid not enlarged, symmetric, no tenderness/mass/nodules Back: symmetric, no curvature. ROM normal. No CVA tenderness. Lungs: clear to auscultation bilaterally Breasts: normal appearance, no masses or tenderness, Inspection negative, No nipple retraction or dimpling, No nipple discharge or bleeding, No axillary or supraclavicular adenopathy, Normal to palpation without dominant masses. Fibroadenoma palpated in left breast. Heart: regular rate and rhythm, S1, S2 normal, no murmur, click, rub or gallop Abdomen: soft, non-tender; bowel sounds normal; no masses,  no organomegaly Pelvic: cervix normal in appearance, external genitalia normal, no adnexal masses or tenderness, no cervical motion tenderness, rectovaginal septum normal, uterus normal size, shape, and consistency and vagina normal without discharge Extremities: extremities normal, atraumatic, no cyanosis or edema Pulses: 2+ and symmetric Skin: Multiple pigmented nevi.  Areas of sun damage.  She has a few abrasions on the shoulders Lymph nodes: Cervical, supraclavicular, and axillary nodes normal. Neurologic: Alert and oriented X 3, normal strength and tone. Normal symmetric reflexes. Normal coordination and gait Psych: Mood is stable.  She is somewhat anxious appearing on exam.  Depression screen [facility] 2/9 05/22/2020 10/04/2019 07/28/2018  Decreased Interest 0 0 1  Down, Depressed, Hopeless 0 0 0  PHQ - 2 Score 0 0 1  Altered sleeping - 0 1  Tired, decreased energy - 0 1  Change in appetite - 0 1  Feeling bad or failure about yourself  - 0 2  Trouble concentrating - 0 1  Moving slowly or fidgety/restless - 0 1  Suicidal thoughts - 0 0  PHQ-9 Score - 0 8  Difficult doing work/chores - - Somewhat difficult    Assessment/ Plan: [name] here for annual physical exam.    Well woman exam with routine gynecological exam  Screening for malignant neoplasm of cervix - Plan: Cytology - PAP  Anxiety - Plan: TSH, escitalopram (LEXAPRO) 20 MG tablet  Moderate mixed hyperlipidemia not requiring statin therapy - Plan: Lipid panel, CMP14+EGFR, TSH  Multiple pigmented nevi - Plan: Ambulatory referral to Dermatology  Screening, anemia, deficiency, iron - Plan: CBC  Will take over OB/GYN care.  Previously seen by Dr. Adah Perl.  In need of Pap this was obtained during today's visit.  Pelvic exam was fairly unremarkable  Still having some anxiety but also going through counseling for childhood traumas.  She will continue Lexapro and this has been renewed.  She will come in for fasting labs and we will check a TSH as well.  She is to continue seeing Lou Cal weekly as directed.  She will follow-up with me in 4 months, sooner if needed.  If she has ongoing uncontrolled anxiety, would consider starting buspirone.  Would want to avoid benzodiazepine class given history of alcohol use.  Check fasting lipid panel given history of hyperlipidemia in the past  Referral to dermatology placed for multiple pigmented nevi.  No known family history of cancers  Plan for screening for iron deficiency anemia.  She has had issues with this in the past  Would like to obtain colonoscopy in this patient that is now 45 years old.  She will contact her insurance to confirm that colonoscopy is covered.  We discussed current guidelines.  No known family history of colon cancers.  She is totally asymptomatic and demonstrating no red flags.  Handout on healthy lifestyle choices, including diet (rich in fruits, vegetables and lean meats and low in salt and simple carbohydrates) and exercise (at least 30 minutes of moderate physical activity daily).  Matin Mattioli M. Lajuana Ripple, DO

## 2020-05-22 NOTE — Patient Instructions (Signed)
Schedule Mammogram at checkout  Call insurance to check on colonoscopy coverage at age 45. Let me know and I will place referral  If anxiety does not improve with therapy.  See me back.  We can consider Buspar as an adjunctive medication  Come in for fasting labs   Preventive Care 70-62 Years Old, Female Preventive care refers to lifestyle choices and visits with your health care provider that can promote health and wellness. This includes:  A yearly physical exam. This is also called an annual wellness visit.  Regular dental and eye exams.  Immunizations.  Screening for certain conditions.  Healthy lifestyle choices, such as: ? Eating a healthy diet. ? Getting regular exercise. ? Not using drugs or products that contain nicotine and tobacco. ? Limiting alcohol use. What can I expect for my preventive care visit? Physical exam Your health care provider will check your:  Height and weight. These may be used to calculate your BMI (body mass index). BMI is a measurement that tells if you are at a healthy weight.  Heart rate and blood pressure.  Body temperature.  Skin for abnormal spots. Counseling Your health care provider may ask you questions about your:  Past medical problems.  Family's medical history.  Alcohol, tobacco, and drug use.  Emotional well-being.  Home life and relationship well-being.  Sexual activity.  Diet, exercise, and sleep habits.  Work and work Statistician.  Access to firearms.  Method of birth control.  Menstrual cycle.  Pregnancy history. What immunizations do I need? Vaccines are usually given at various ages, according to a schedule. Your health care provider will recommend vaccines for you based on your age, medical history, and lifestyle or other factors, such as travel or where you work.   What tests do I need? Blood tests  Lipid and cholesterol levels. These may be checked every 5 years, or more often if you are over 59  years old.  Hepatitis C test.  Hepatitis B test. Screening  Lung cancer screening. You may have this screening every year starting at age 76 if you have a 30-pack-year history of smoking and currently smoke or have quit within the past 15 years.  Colorectal cancer screening. ? All adults should have this screening starting at age 45 and continuing until age 37. ? Your health care provider may recommend screening at age 59 if you are at increased risk. ? You will have tests every 1-10 years, depending on your results and the type of screening test.  Diabetes screening. ? This is done by checking your blood sugar (glucose) after you have not eaten for a while (fasting). ? You may have this done every 1-3 years.  Mammogram. ? This may be done every 1-2 years. ? Talk with your health care provider about when you should start having regular mammograms. This may depend on whether you have a family history of breast cancer.  BRCA-related cancer screening. This may be done if you have a family history of breast, ovarian, tubal, or peritoneal cancers.  Pelvic exam and Pap test. ? This may be done every 3 years starting at age 35. ? Starting at age 55, this may be done every 5 years if you have a Pap test in combination with an HPV test. Other tests  STD (sexually transmitted disease) testing, if you are at risk.  Bone density scan. This is done to screen for osteoporosis. You may have this scan if you are at high risk for osteoporosis.  Talk with your health care provider about your test results, treatment options, and if necessary, the need for more tests. Follow these instructions at home: Eating and drinking  Eat a diet that includes fresh fruits and vegetables, whole grains, lean protein, and low-fat dairy products.  Take vitamin and mineral supplements as recommended by your health care provider.  Do not drink alcohol if: ? Your health care provider tells you not to drink. ? You  are pregnant, may be pregnant, or are planning to become pregnant.  If you drink alcohol: ? Limit how much you have to 0-1 drink a day. ? Be aware of how much alcohol is in your drink. In the [place] one drink equals one 12 oz bottle of beer (355 mL), one 5 oz glass of wine (148 mL), or one 1 oz glass of hard liquor (44 mL).   Lifestyle  Take daily care of your teeth and gums. Brush your teeth every morning and night with fluoride toothpaste. Floss one time each day.  Stay active. Exercise for at least 30 minutes 5 or more days each week.  Do not use any products that contain nicotine or tobacco, such as cigarettes, e-cigarettes, and chewing tobacco. If you need help quitting, ask your health care provider.  Do not use drugs.  If you are sexually active, practice safe sex. Use a condom or other form of protection to prevent STIs (sexually transmitted infections).  If you do not wish to become pregnant, use a form of birth control. If you plan to become pregnant, see your health care provider for a prepregnancy visit.  If told by your health care provider, take low-dose aspirin daily starting at age 53.  Find healthy ways to cope with stress, such as: ? Meditation, yoga, or listening to music. ? Journaling. ? Talking to a trusted person. ? Spending time with friends and family. Safety  Always wear your seat belt while driving or riding in a vehicle.  Do not drive: ? If you have been drinking alcohol. Do not ride with someone who has been drinking. ? When you are tired or distracted. ? While texting.  Wear a helmet and other protective equipment during sports activities.  If you have firearms in your house, make sure you follow all gun safety procedures. What's next?  Visit your health care provider once a year for an annual wellness visit.  Ask your health care provider how often you should have your eyes and teeth checked.  Stay up to date on all vaccines. This information  is not intended to replace advice given to you by your health care provider. Make sure you discuss any questions you have with your health care provider. Document Revised: 12/27/2019 Document Reviewed: 12/03/2017 Elsevier Patient Education  2021 [employer].

## 2020-05-24 LAB — CYTOLOGY - PAP
Adequacy: ABSENT
Comment: NEGATIVE
Diagnosis: UNDETERMINED — AB
High risk HPV: NEGATIVE

## 2020-05-25 ENCOUNTER — Encounter (INDEPENDENT_AMBULATORY_CARE_PROVIDER_SITE_OTHER): Payer: Self-pay

## 2020-05-25 ENCOUNTER — Other Ambulatory Visit: Payer: Medicaid Other

## 2020-05-25 ENCOUNTER — Other Ambulatory Visit: Payer: Self-pay

## 2020-05-25 DIAGNOSIS — Z130 Encounter for screening for diseases of the blood and blood-forming organs and certain disorders involving the immune mechanism: Secondary | ICD-10-CM

## 2020-05-25 DIAGNOSIS — E782 Mixed hyperlipidemia: Secondary | ICD-10-CM

## 2020-05-25 DIAGNOSIS — F419 Anxiety disorder, unspecified: Secondary | ICD-10-CM

## 2020-05-26 LAB — CMP14+EGFR
ALT: 14 IU/L (ref 0–32)
AST: 16 IU/L (ref 0–40)
Albumin/Globulin Ratio: 2.1 (ref 1.2–2.2)
Albumin: 4.5 g/dL (ref 3.8–4.8)
Alkaline Phosphatase: 46 IU/L (ref 44–121)
BUN/Creatinine Ratio: 17 (ref 9–23)
BUN: 16 mg/dL (ref 6–24)
Bilirubin Total: 0.4 mg/dL (ref 0.0–1.2)
CO2: 21 mmol/L (ref 20–29)
Calcium: 9.1 mg/dL (ref 8.7–10.2)
Chloride: 105 mmol/L (ref 96–106)
Creatinine, Ser: 0.92 mg/dL (ref 0.57–1.00)
GFR calc Af Amer: 87 mL/min/{1.73_m2} (ref >59)
GFR calc non Af Amer: 75 mL/min/{1.73_m2} (ref >59)
Globulin, Total: 2.1 g/dL (ref 1.5–4.5)
Glucose: 95 mg/dL (ref 65–99)
Potassium: 4.7 mmol/L (ref 3.5–5.2)
Sodium: 140 mmol/L (ref 134–144)
Total Protein: 6.6 g/dL (ref 6.0–8.5)

## 2020-05-26 LAB — LIPID PANEL
Chol/HDL Ratio: 3.1 ratio (ref 0.0–4.4)
Cholesterol, Total: 165 mg/dL (ref 100–199)
HDL: 53 mg/dL (ref >39)
LDL Chol Calc (NIH): 97 mg/dL (ref 0–99)
Triglycerides: 80 mg/dL (ref 0–149)
VLDL Cholesterol Cal: 15 mg/dL (ref 5–40)

## 2020-05-26 LAB — CBC
Hematocrit: 39.6 % (ref 34.0–46.6)
Hemoglobin: 13.2 g/dL (ref 11.1–15.9)
MCH: 32 pg (ref 26.6–33.0)
MCHC: 33.3 g/dL (ref 31.5–35.7)
MCV: 96 fL (ref 79–97)
Platelets: 158 10*3/uL (ref 150–450)
RBC: 4.13 x10E6/uL (ref 3.77–5.28)
RDW: 12.6 % (ref 11.7–15.4)
WBC: 3.8 10*3/uL (ref 3.4–10.8)

## 2020-05-26 LAB — TSH: TSH: 1.91 u[IU]/mL (ref 0.450–4.500)

## 2020-05-28 ENCOUNTER — Encounter: Payer: Self-pay | Admitting: Family Medicine

## 2020-06-27 ENCOUNTER — Other Ambulatory Visit: Payer: Self-pay

## 2020-06-27 ENCOUNTER — Ambulatory Visit (INDEPENDENT_AMBULATORY_CARE_PROVIDER_SITE_OTHER): Payer: 59 | Admitting: Nurse Practitioner

## 2020-06-27 ENCOUNTER — Encounter: Payer: Self-pay | Admitting: Nurse Practitioner

## 2020-06-27 VITALS — BP 106/71 | HR 52 | Temp 97.9°F | Resp 20 | Ht 66.0 in | Wt 159.0 lb

## 2020-06-27 DIAGNOSIS — H6505 Acute serous otitis media, recurrent, left ear: Secondary | ICD-10-CM

## 2020-06-27 DIAGNOSIS — H60392 Other infective otitis externa, left ear: Secondary | ICD-10-CM | POA: Diagnosis not present

## 2020-06-27 MED ORDER — AMOXICILLIN-POT CLAVULANATE 875-125 MG PO TABS: 1.0000 | ORAL_TABLET | Freq: Two times a day (BID) | ORAL | 0 refills | Status: DC

## 2020-06-27 MED ORDER — NEOMYCIN-POLYMYXIN-HC 3.5-10000-1 OT SOLN: 3.0000 [drp] | Freq: Four times a day (QID) | OTIC | 0 refills | Status: DC

## 2020-06-27 NOTE — Patient Instructions (Signed)
Otitis Media, Adult  Otitis media is a condition in which the middle ear is red and swollen (inflamed) and full of fluid. The middle ear is the part of the ear that contains bones for hearing as well as air that helps send sounds to the brain. The condition usually goes away on its own. What are the causes? This condition is caused by a blockage in the eustachian tube. The eustachian tube connects the middle ear to the back of the nose. It normally allows air into the middle ear. The blockage is caused by fluid or swelling. Problems that can cause blockage include:  A cold or infection that affects the nose, mouth, or throat.  Allergies.  An irritant, such as tobacco smoke.  Adenoids that have become large. The adenoids are soft tissue located in the back of the throat, behind the nose and the roof of the mouth.  Growth or swelling in the upper part of the throat, just behind the nose (nasopharynx).  Damage to the ear caused by change in pressure. This is called barotrauma. What are the signs or symptoms? Symptoms of this condition include:  Ear pain.  Fever.  Problems with hearing.  Being tired.  Fluid leaking from the ear.  Ringing in the ear. How is this treated? This condition can go away on its own within 3-5 days. But if the condition is caused by bacteria or does not go away on its own, or if it keeps coming back, your doctor may:  Give you antibiotic medicines.  Give you medicines for pain. Follow these instructions at home:  Take over-the-counter and prescription medicines only as told by your doctor.  If you were prescribed an antibiotic medicine, take it as told by your doctor. Do not stop taking the antibiotic even if you start to feel better.  Keep all follow-up visits as told by your doctor. This is important. Contact a doctor if:  You have bleeding from your nose.  There is a lump on your neck.  You are not feeling better in 5 days.  You feel worse  instead of better. Get help right away if:  You have pain that is not helped with medicine.  You have swelling, redness, or pain around your ear.  You get a stiff neck.  You cannot move part of your face (paralysis).  You notice that the bone behind your ear hurts when you touch it.  You get a very bad headache. Summary  Otitis media means that the middle ear is red, swollen, and full of fluid.  This condition usually goes away on its own.  If the problem does not go away, treatment may be needed. You may be given medicines to treat the infection or to treat your pain.  If you were prescribed an antibiotic medicine, take it as told by your doctor. Do not stop taking the antibiotic even if you start to feel better.  Keep all follow-up visits as told by your doctor. This is important. This information is not intended to replace advice given to you by your health care provider. Make sure you discuss any questions you have with your health care provider. Document Revised: 02/24/2019 Document Reviewed: 02/24/2019 Elsevier Patient Education  2021 [employer] Inc[employer]  

## 2020-06-27 NOTE — Progress Notes (Signed)
   Subjective:    Patient ID: Marie Baxter, female    DOB: [birth year], 45 y.o.   MRN: [id number]   Chief Complaint: Ear Pain (Left/)   HPI Patient comes in c/o left ear pain. She gets ear infection every year. Ear started hurting two days ago. No drainage.   Review of Systems  Constitutional: Negative for diaphoresis.  Eyes: Negative for pain.  Respiratory: Negative for shortness of breath.   Cardiovascular: Negative for chest pain, palpitations and leg swelling.  Gastrointestinal: Negative for abdominal pain.  Endocrine: Negative for polydipsia.  Skin: Negative for rash.  Neurological: Negative for dizziness, weakness and headaches.  Hematological: Does not bruise/bleed easily.  All other systems reviewed and are negative.      Objective:   Physical Exam Vitals and nursing note reviewed.  Constitutional:      Appearance: Normal appearance.  HENT:     Right Ear: Tympanic membrane normal.     Ears:     Comments: Left tragus tenderness Yellowish exudate in ear canal Erythematous and edmeatous ear canal on left TM pink appearing on left    Nose: Nose normal.     Mouth/Throat:     Mouth: Mucous membranes are moist.  Cardiovascular:     Rate and Rhythm: Normal rate and regular rhythm.     Heart sounds: Normal heart sounds.  Pulmonary:     Breath sounds: Normal breath sounds.  Skin:    General: Skin is warm.  Neurological:     General: No focal deficit present.     Mental Status: She is alert and oriented to person, place, and time.  Psychiatric:        Mood and Affect: Mood normal.        Behavior: Behavior normal.    BP 106/71   Pulse (!) 52   Temp 97.9 F (36.6 C) (Temporal)   Resp 20   Ht 5\' 6"  (1.676 m)   Wt 159 lb (72.1 kg)   SpO2 99%   BMI 25.66 kg/m          Assessment & Plan:  Marie Baxter in today with chief complaint of Ear Pain (Left/)   1. 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  Dispense: 14 tablet; Refill: 0  2. Other infective acute otitis externa of left ear - neomycin-polymyxin-hydrocortisone (CORTISPORIN) OTIC solution; Place 3 drops into the left ear 4 (four) times daily.  Dispense: 10 mL; Refill: 0  Avoid getting water in ears Motrin or tylenol OTC for pain RTO prn  The above assessment and management plan was discussed with the patient. The patient verbalized understanding of and has agreed to the management plan. Patient is aware to call the clinic if symptoms persist or worsen. Patient is aware when to return to the clinic for a follow-up visit. Patient educated on when it is appropriate to go to the emergency department.   Mary-Margaret Hassell Done, FNP

## 2020-07-05 ENCOUNTER — Other Ambulatory Visit: Payer: Self-pay | Admitting: Family Medicine

## 2020-07-05 DIAGNOSIS — Z1231 Encounter for screening mammogram for malignant neoplasm of breast: Secondary | ICD-10-CM

## 2020-07-17 ENCOUNTER — Ambulatory Visit (INDEPENDENT_AMBULATORY_CARE_PROVIDER_SITE_OTHER): Payer: 59 | Admitting: Nurse Practitioner

## 2020-07-17 ENCOUNTER — Other Ambulatory Visit: Payer: Self-pay

## 2020-07-17 ENCOUNTER — Encounter: Payer: Self-pay | Admitting: Nurse Practitioner

## 2020-07-17 VITALS — BP 113/76 | HR 62 | Temp 98.3°F | Ht 66.0 in | Wt 156.0 lb

## 2020-07-17 DIAGNOSIS — H60312 Diffuse otitis externa, left ear: Secondary | ICD-10-CM | POA: Diagnosis not present

## 2020-07-17 DIAGNOSIS — H6505 Acute serous otitis media, recurrent, left ear: Secondary | ICD-10-CM | POA: Insufficient documentation

## 2020-07-17 MED ORDER — CIPROFLOXACIN HCL 500 MG PO TABS
500.0000 mg | ORAL_TABLET | Freq: Two times a day (BID) | ORAL | 0 refills | Status: AC
Start: 2020-07-17 — End: ?

## 2020-07-17 NOTE — Assessment & Plan Note (Signed)
Symptoms not well controlled with previous and ongoing treatments.  Patient is reporting worsening ear pain, fluid like sensation in the left ear, patient is reporting mild chills and low-grade fever.  Patient is afebrile in clinic today.  After visualizing patient's ear, right ear has fluid and left ear canal has wax buildup and some fluid showing on the eardrums. Education provided to patient to cut back on any type of swimming until ears are better.  Started patient on ciprofloxacin 500 mg twice daily for 7 days. Follow-up with worsening unresolved symptoms.

## 2020-07-17 NOTE — Patient Instructions (Signed)

## 2020-07-17 NOTE — Progress Notes (Signed)
Lab Results  Component Value Date   HDL 53 05/25/2020    Acute Office Visit  Subjective:    Patient ID: Marie Baxter, female    DOB: Feb 01, 1976, 45 y.o.   MRN: [id number]  Chief Complaint  Patient presents with  . Ear Pain    Otalgia  There is pain in the left ear. This is a recurrent problem. The current episode started 1 to 4 weeks ago. The problem occurs constantly. The problem has been unchanged. Maximum temperature: chills. The pain is moderate. Pertinent negatives include no ear discharge, headaches, hearing loss or sore throat. She has tried antibiotics and ear drops for the symptoms. The treatment provided mild relief.      Past Medical History:  Diagnosis Date  . Anxiety     Past Surgical History:  Procedure Laterality Date  . CESAREAN SECTION  2011  . CESAREAN SECTION  2015  . FEMORAL HERNIA REPAIR Left   . KNEE ARTHROSCOPY WITH ANTERIOR CRUCIATE LIGAMENT (ACL) REPAIR      Family History  Problem Relation Age of Onset  . COPD Mother   . Cancer Mother        unknown GYN cancer  . Stroke Mother 55  . Cancer Maternal Aunt        breast cancer  . Breast cancer Maternal Aunt   . Cancer Maternal Grandmother        breast cancer  . COPD Maternal Grandmother   . Breast cancer Maternal Grandmother   . Cancer Father        oral  . Alcohol abuse Father   . Healthy Sister   . Healthy Son   . Healthy Sister   . Allergic rhinitis Son     Social History   Socioeconomic History  . Marital status: Married    Spouse name: Not on file  . Number of children: 2  . Years of education: Not on file  . Highest education level: Not on file  Occupational History  . 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  Tobacco Use  . Smoking status: Former Smoker    Types: Cigarettes  . Smokeless tobacco: Never Used  Vaping Use  . Vaping Use: Never used  Substance and Sexual Activity  . Alcohol use: Yes    Alcohol/week: 6.0 standard drinks     Types: 6 Cans of beer per week    Comment: maybe twice per month  . Drug use: No  . Sexual activity: Yes    Birth control/protection: Surgical  Other Topics Concern  . Not on file  Social History Narrative  . Not on file   Social Determinants of Health   Financial Resource Strain: Not on file  Food Insecurity: Not on file  Transportation Needs: Not on file  Physical Activity: Not on file  Stress: Not on file  Social Connections: Not on file  Intimate Partner Violence: Not on file    Outpatient Medications Prior to Visit  Medication Sig Dispense Refill  . escitalopram (LEXAPRO) 20 MG tablet Take 1 tablet (20 mg total) by mouth daily. 90 tablet 3  . neomycin-polymyxin-hydrocortisone (CORTISPORIN) OTIC solution Place 3 drops into the left ear 4 (four) times daily. 10 mL 0  . amoxicillin-clavulanate (AUGMENTIN) 875-125 MG tablet Take 1 tablet by mouth 2 (two) times daily. 14 tablet 0   No facility-administered medications prior to visit.    No Known Allergies  Review of Systems  HENT: Positive for  ear pain. Negative for ear discharge, hearing loss and sore throat.   Respiratory: Negative.   Gastrointestinal: Negative.   Musculoskeletal: Negative.   Neurological: Negative for headaches.  All other systems reviewed and are negative.      Objective:    Physical Exam Constitutional:      Appearance: Normal appearance.  HENT:     Head: Normocephalic.     Right Ear: There is no impacted cerumen.     Left Ear: There is no impacted cerumen.     Nose: Nose normal.     Mouth/Throat:     Mouth: Mucous membranes are moist.     Pharynx: Oropharynx is clear. No oropharyngeal exudate.  Eyes:     Conjunctiva/sclera: Conjunctivae normal.  Cardiovascular:     Rate and Rhythm: Normal rate and regular rhythm.  Pulmonary:     Effort: Pulmonary effort is normal.     Breath sounds: Normal breath sounds.  Abdominal:     General: Bowel sounds are normal.  Musculoskeletal:         General: Normal range of motion.  Skin:    General: Skin is warm.  Neurological:     Mental Status: She is alert and oriented to person, place, and time.     BP 113/76   Pulse 62   Temp 98.3 F (36.8 C) (Temporal)   Ht 5\' 6"  (1.676 m)   Wt 156 lb (70.8 kg)   SpO2 99%   BMI 25.18 kg/m  Wt Readings from Last 3 Encounters:  07/17/20 156 lb (70.8 kg)  06/27/20 159 lb (72.1 kg)  05/22/20 158 lb (71.7 kg)    Health Maintenance Due  Topic Date Due  . Hepatitis C Screening  Never done  . COLONOSCOPY (Pts 45-58yrs Insurance coverage will need to be confirmed)  Never done    There are no preventive care reminders to display for this patient.   Lab Results  Component Value Date   TSH 1.910 05/25/2020   Lab Results  Component Value Date   WBC 3.8 05/25/2020   HGB 13.2 05/25/2020   HCT 39.6 05/25/2020   MCV 96 05/25/2020   PLT 158 05/25/2020   Lab Results  Component Value Date   NA 140 05/25/2020   K 4.7 05/25/2020   CO2 21 05/25/2020   GLUCOSE 95 05/25/2020   BUN 16 05/25/2020   CREATININE 0.92 05/25/2020   BILITOT 0.4 05/25/2020   ALKPHOS 46 05/25/2020   AST 16 05/25/2020   ALT 14 05/25/2020   PROT 6.6 05/25/2020   ALBUMIN 4.5 05/25/2020   CALCIUM 9.1 05/25/2020   Lab Results  Component Value Date   CHOL 165 05/25/2020   Lab Results  Component Value Date   HDL 53 05/25/2020   Lab Results  Component Value Date   LDLCALC 97 05/25/2020   Lab Results  Component Value Date   TRIG 80 05/25/2020   Lab Results  Component Value Date   CHOLHDL 3.1 05/25/2020   No results found for: HGBA1C     Assessment & Plan:   Problem List Items Addressed This Visit      Nervous and Auditory   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  Patient is reporting worsening ear pain, fluid like sensation in the left ear, patient is reporting mild chills and low-grade fever.  Patient is  afebrile in clinic today.  After visualizing patient's  ear, right ear has fluid and left ear canal has wax buildup and some fluid showing on the eardrums. Education provided to patient to cut back on any type of swimming until ears are better.  Started patient on ciprofloxacin 500 mg twice daily for 7 days. Follow-up with worsening unresolved symptoms.      Relevant Medications   ciprofloxacin (CIPRO) 500 MG tablet       Meds ordered this encounter  Medications  . ciprofloxacin (CIPRO) 500 MG tablet    Sig: Take 1 tablet (500 mg total) by mouth 2 (two) times daily.    Dispense:  14 tablet    Refill:  0    Order Specific Question:   Supervising Provider    Answer:   Janora Norlander [[id number]]     Ivy Lynn, NP

## 2020-09-19 ENCOUNTER — Encounter: Payer: Self-pay | Admitting: Family Medicine

## 2020-09-19 ENCOUNTER — Ambulatory Visit: Payer: Medicaid Other | Admitting: Family Medicine

## 2020-12-17 ENCOUNTER — Ambulatory Visit
Admission: RE | Admit: 2020-12-17 | Discharge: 2020-12-17 | Disposition: A | Discharge status: Home / Self Care | Payer: 59 | Source: Ambulatory Visit | Attending: Family Medicine | Admitting: Family Medicine

## 2020-12-17 ENCOUNTER — Other Ambulatory Visit: Payer: Self-pay

## 2020-12-17 DIAGNOSIS — Z1231 Encounter for screening mammogram for malignant neoplasm of breast: Secondary | ICD-10-CM

## 2020-12-24 ENCOUNTER — Other Ambulatory Visit: Payer: Self-pay | Admitting: Family Medicine

## 2020-12-24 DIAGNOSIS — R928 Other abnormal and inconclusive findings on diagnostic imaging of breast: Secondary | ICD-10-CM

## 2021-03-31 ENCOUNTER — Telehealth: Payer: 59 | Admitting: Physician Assistant

## 2021-03-31 DIAGNOSIS — H60332 Swimmer's ear, left ear: Secondary | ICD-10-CM | POA: Diagnosis not present

## 2021-03-31 MED ORDER — CIPROFLOXACIN-DEXAMETHASONE 0.3-0.1 % OT SUSP: 4.0000 [drp] | Freq: Two times a day (BID) | OTIC | 0 refills | Status: AC

## 2021-03-31 NOTE — Patient Instructions (Signed)
Marie Baxter, thank you for joining Mar Daring, PA-C for today's virtual visit.  While this provider is not your primary care provider (PCP), if your PCP is located in our provider database this encounter information will be shared with them immediately following your visit.  Consent: (Patient) Marie Baxter provided verbal consent for this virtual visit at the beginning of the encounter.  Current Medications:  Current Outpatient Medications:    ciprofloxacin-dexamethasone (CIPRODEX) OTIC suspension, Place 4 drops into the left ear 2 (two) times daily. X 5 days, Disp: 7.5 mL, Rfl: 0   ciprofloxacin (CIPRO) 500 MG tablet, Take 1 tablet (500 mg total) by mouth 2 (two) times daily., Disp: 14 tablet, Rfl: 0   escitalopram (LEXAPRO) 20 MG tablet, Take 1 tablet (20 mg total) by mouth daily., Disp: 90 tablet, Rfl: 3   Medications ordered in this encounter:  Meds ordered this encounter  Medications   ciprofloxacin-dexamethasone (CIPRODEX) OTIC suspension    Sig: Place 4 drops into the left ear 2 (two) times daily. X 5 days    Dispense:  7.5 mL    Refill:  0    Order Specific Question:   Supervising Provider    Answer:   Sabra Heck, BRIAN [[id number]]     *If you need refills on other medications prior to your next appointment, please contact your pharmacy*  Follow-Up: Call back or seek an in-person evaluation if the symptoms worsen or if the condition fails to improve as anticipated.  Other Instructions Otitis Externa Otitis externa is an infection of the outer ear canal. The outer ear canal is the area between the outside of the ear and the eardrum. Otitis externa is sometimes called swimmer's ear. What are the causes? Common causes of this condition include: Swimming in dirty water. Moisture in the ear. An injury to the inside of the ear. An object stuck in the ear. A cut or scrape on the outside of the ear or in the ear canal. What increases the risk? You are more likely to  develop this condition if you go swimming often. What are the signs or symptoms? The first symptom of this condition is often itching in the ear. Later symptoms of the condition include: Swelling of the ear. Redness in the ear. Ear pain. The pain may get worse when you pull on your ear. Pus coming from the ear. How is this diagnosed? This condition may be diagnosed by examining the ear and testing fluid from the ear for bacteria and funguses. How is this treated? This condition may be treated with: Antibiotic ear drops. These are often given for 10-14 days. Medicines to reduce itching and swelling. Follow these instructions at home: If you were prescribed antibiotic ear drops, use them as told by your health care provider. Do not stop using the antibiotic even if you start to feel better. Take over-the-counter and prescription medicines only as told by your health care provider. Avoid getting water in your ears as told by your health care provider. This may include avoiding swimming or water sports for a few days. Keep all follow-up visits. This is important. How is this prevented? Keep your ears dry. Use the corner of a towel to dry your ears after you swim or bathe. Avoid scratching or putting things in your ear. Doing these things can damage the ear canal or remove the protective wax that lines it, which makes it easier for bacteria and funguses to grow. Avoid swimming in lakes, polluted water,  or swimming pools that may not have enough chlorine. Contact a health care provider if: You have a fever. Your ear is still red, swollen, painful, or draining pus after 3 days. Your redness, swelling, or pain gets worse. You have a severe headache. Get help right away if: You have redness, swelling, and pain or tenderness in the area behind your ear. Summary Otitis externa is an infection of the outer ear canal. Common causes include swimming in dirty water, moisture in the ear, or a cut or  scrape in the ear. Symptoms include pain, redness, and swelling of the ear canal. If you were prescribed antibiotic ear drops, use them as told by your health care provider. Do not stop using the antibiotic even if you start to feel better. This information is not intended to replace advice given to you by your health care provider. Make sure you discuss any questions you have with your health care provider. Document Revised: 06/06/2020 Document Reviewed: 06/06/2020 Elsevier Patient Education  2022 [employer].    If you have been instructed to have an in-person evaluation today at a local Urgent Care facility, please use the link below. It will take you to a list of all of our available [facility], including address, phone number and hours of operation. Please do not delay care.  [facility]  If you or a family member do not have a primary care provider, use the link below to schedule a visit and establish care. When you choose a [facility] primary care physician or advanced practice provider, you gain a long-term partner in health. Find a Primary Care Provider  Learn more about [facility] in-office and virtual care options: [facility]

## 2021-03-31 NOTE — Progress Notes (Signed)
Virtual Visit Consent   Marie Baxter, you are scheduled for a virtual visit with a [facility] provider today.     Just as with appointments in the office, your consent must be obtained to participate.  Your consent will be active for this visit and any virtual visit you may have with one of our providers in the next 365 days.     If you have a MyChart account, a copy of this consent can be sent to you electronically.  All virtual visits are billed to your insurance company just like a traditional visit in the office.    As this is a virtual visit, video technology does not allow for your provider to perform a traditional examination.  This may limit your provider's ability to fully assess your condition.  If your provider identifies any concerns that need to be evaluated in person or the need to arrange testing (such as labs, EKG, etc.), we will make arrangements to do so.     Although advances in technology are sophisticated, we cannot ensure that it will always work on either your end or our end.  If the connection with a video visit is poor, the visit may have to be switched to a telephone visit.  With either a video or telephone visit, we are not always able to ensure that we have a secure connection.     I need to obtain your verbal consent now.   Are you willing to proceed with your visit today?    Marie Baxter has provided verbal consent on 03/31/2021 for a virtual visit (video or telephone).   Mar Daring, PA-C   Date: 03/31/2021 4:05 PM   Virtual Visit via Video Note   I, Mar Daring, connected with  Marie Baxter  ([id number], [birth year]) on 03/31/21 at  4:00 PM EST by a video-enabled telemedicine application and verified that I am speaking with the correct person using two identifiers.  Location: Patient: Virtual Visit Location Patient: Home Provider: Virtual Visit Location Provider: Home Office   I discussed the limitations of evaluation and management by  telemedicine and the availability of in person appointments. The patient expressed understanding and agreed to proceed.    History of Present Illness: Marie Baxter is a 45 y.o. who identifies as a female who was assigned female at birth, and is being seen today for ear pain.  HPI: Otalgia  There is pain in the left ear. This is a recurrent problem. The current episode started today. The problem occurs constantly. The problem has been gradually worsening. There has been no fever. Pertinent negatives include no coughing, ear discharge, headaches, hearing loss, rhinorrhea or sore throat. She has tried NSAIDs for the symptoms. The treatment provided no relief. Her past medical history is significant for a chronic ear infection.   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 X 5 days, Disp: 7.5 mL, Rfl: 0   ciprofloxacin (CIPRO) 500 MG tablet, Take 1 tablet (500 mg total) by mouth 2 (two) times daily., Disp: 14 tablet, Rfl: 0  escitalopram (LEXAPRO) 20 MG tablet, Take 1 tablet (20 mg total) by mouth daily., Disp: 90 tablet, Rfl: 3  Observations/Objective: Patient is well-developed, well-nourished in no acute distress.  Resting comfortably at home.  Head is normocephalic, atraumatic.  No labored breathing.  Speech is clear and coherent with logical content.  Patient is alert and oriented at baseline.    Assessment and Plan: 1. Acute swimmer's ear of left side - ciprofloxacin-dexamethasone (CIPRODEX) OTIC suspension; Place 4 drops into the left ear 2 (two) times daily. X 5 days   Dispense: 7.5 mL; Refill: 0  - Ciprodex prescribed as above - Seek in person evaluation if worsening or fails to respond to treatment  Follow Up Instructions: I discussed the assessment and treatment plan with the patient. The patient was provided an opportunity to ask questions and all were answered. The patient agreed with the plan and demonstrated an understanding of the instructions.  A copy of instructions were sent to the patient via MyChart unless otherwise noted below.   The patient was advised to call back or seek an in-person evaluation if the symptoms worsen or if the condition fails to improve as anticipated.  Time:  I spent 8 minutes with the patient via telehealth technology discussing the above problems/concerns.    Mar Daring, PA-C

## 2021-06-07 ENCOUNTER — Encounter: Payer: Self-pay | Admitting: Family Medicine

## 2022-04-22 IMAGING — MG MM DIGITAL SCREENING BILAT W/ TOMO AND CAD
8 series · 9 of 24 positions shown · non-contrast
Comparison: Previous exam(s).

CLINICAL DATA: Screening.

EXAM:
DIGITAL SCREENING BILATERAL MAMMOGRAM WITH TOMOSYNTHESIS AND CAD
TECHNIQUE: Bilateral screening digital craniocaudal and mediolateral oblique
mammograms were obtained. Bilateral screening digital breast
tomosynthesis was performed. The images were evaluated with
computer-aided detection.

[L CC synth-2D]
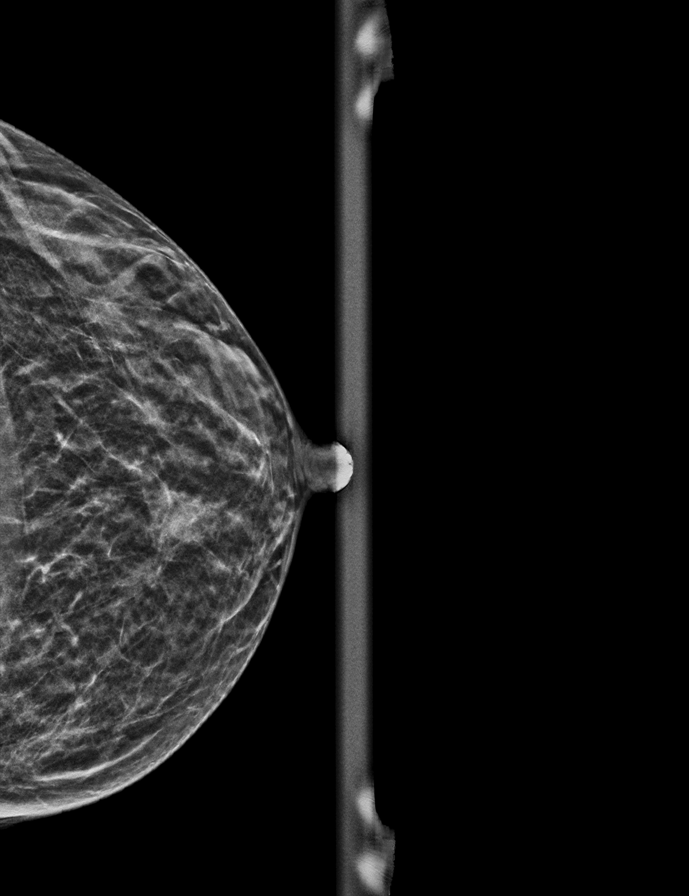

[R CC synth-2D]
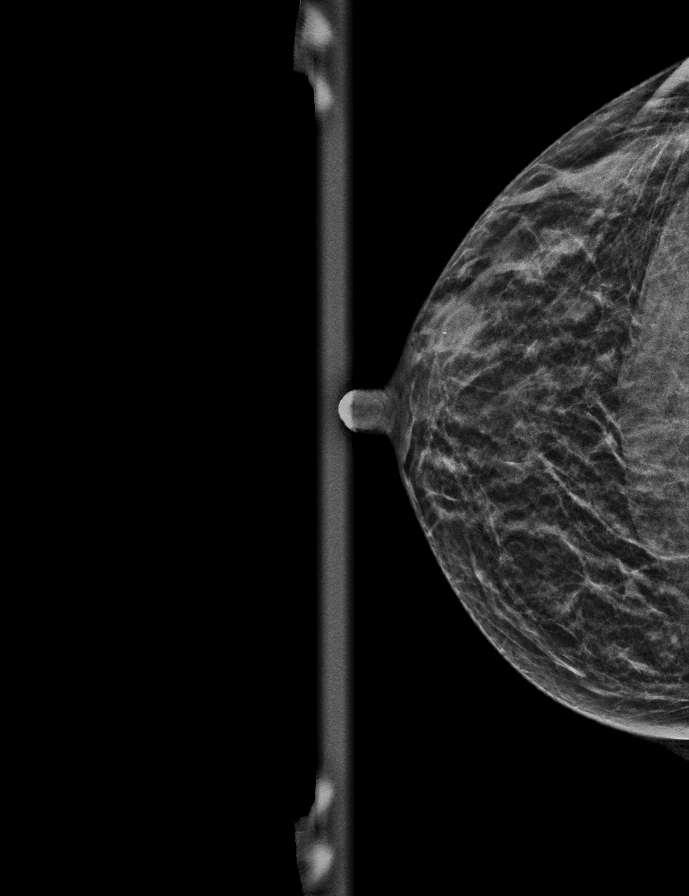

[R MLO synth-2D]
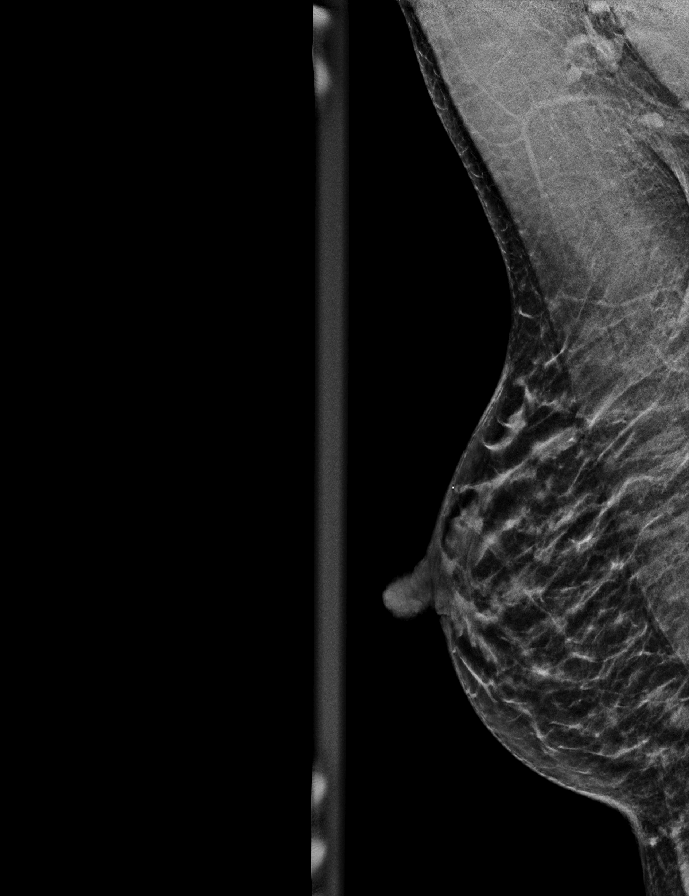

[L MLO synth-2D]
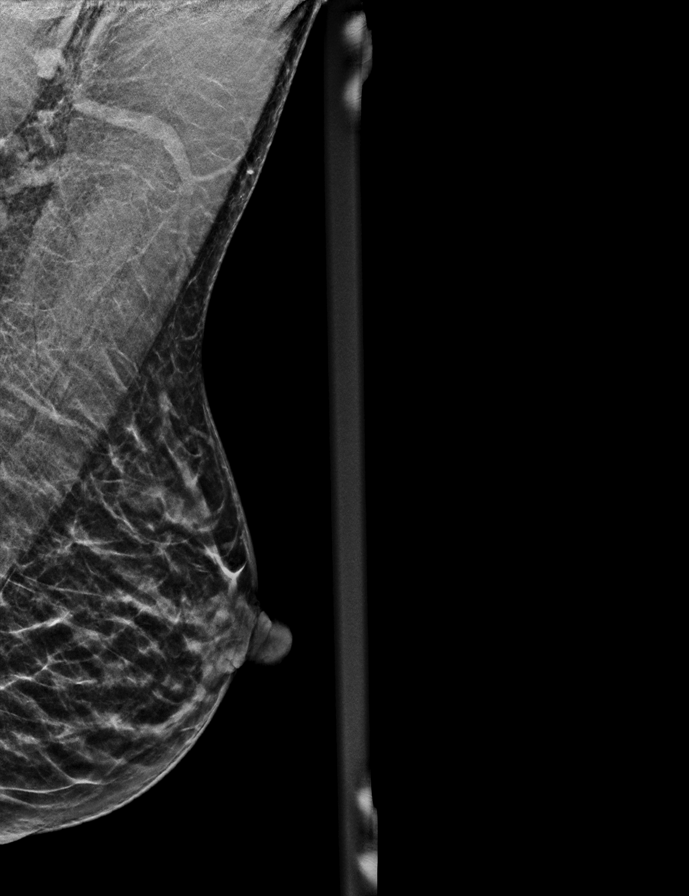

[L MLO tomo · 2 of 37 frames shown]
[frame 13/37]
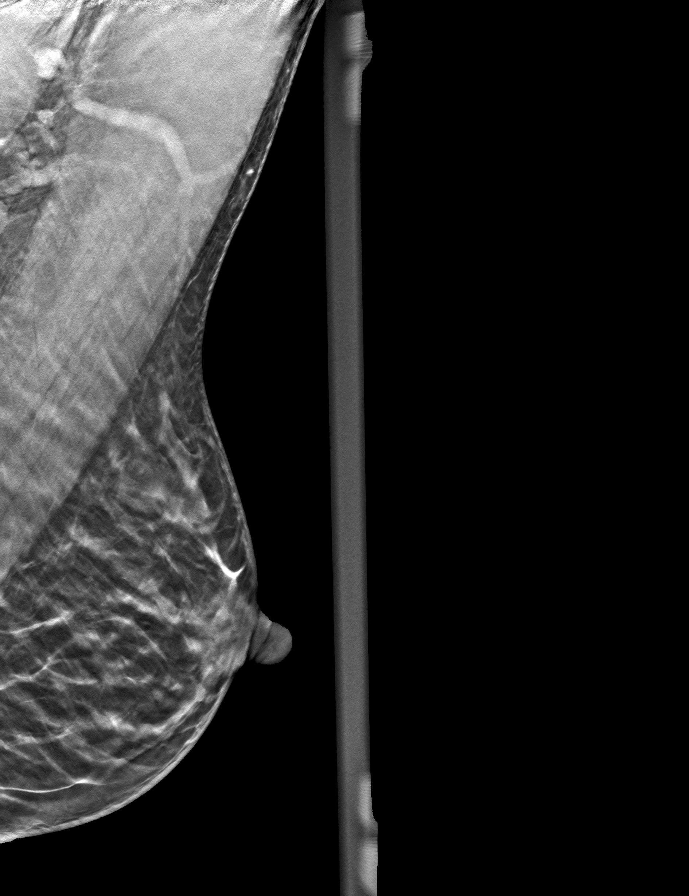
[frame 19/37]
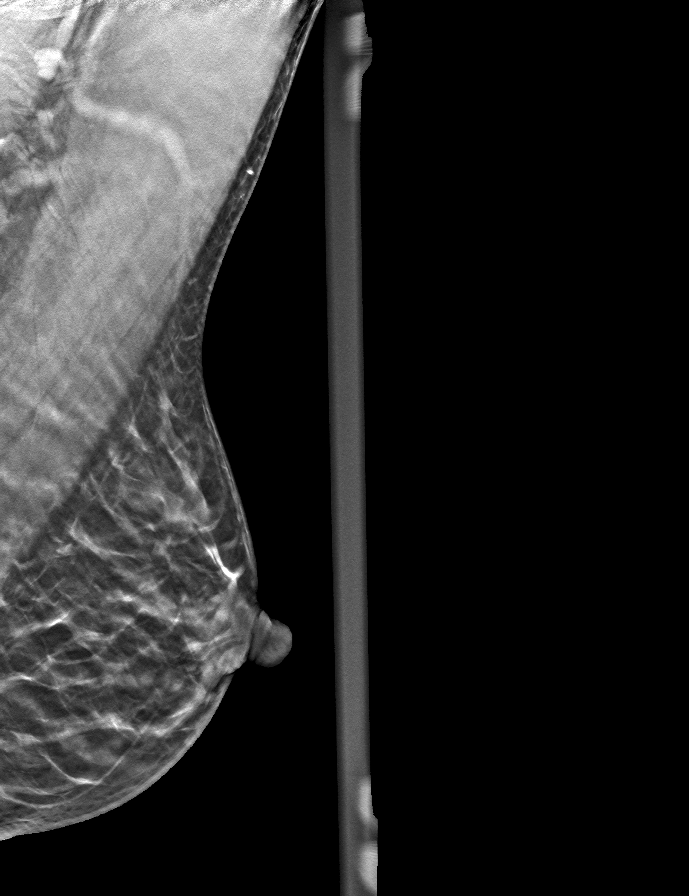

[L CC tomo · tomo slice 16/31.0]
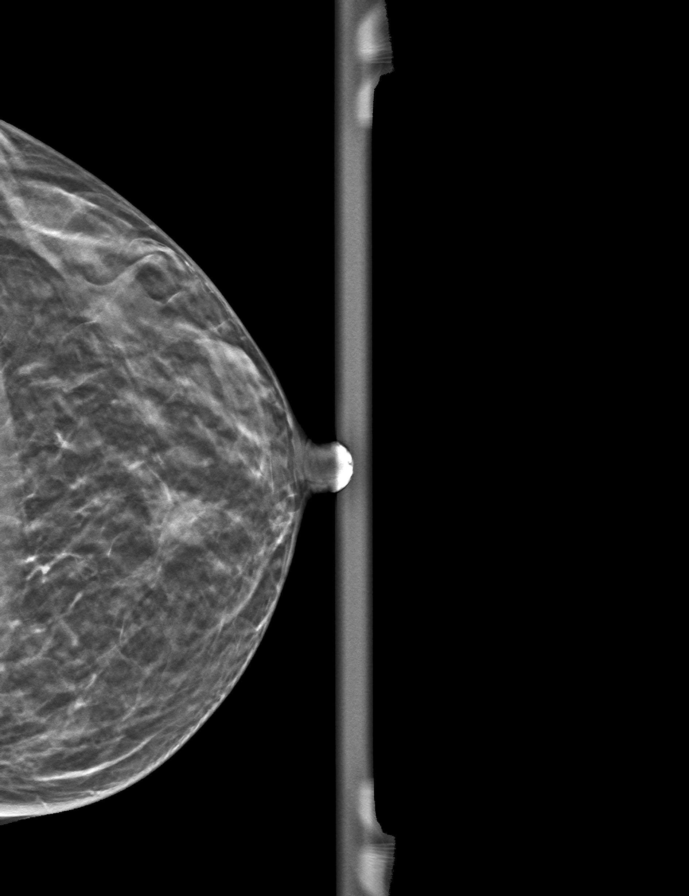

[R CC tomo · tomo slice 16/31.0]
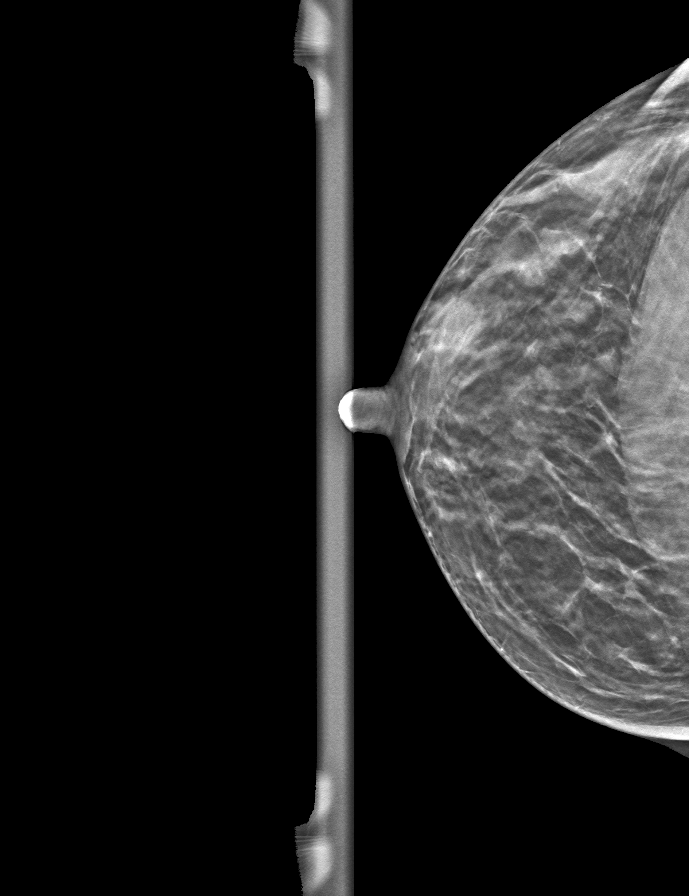

[R MLO tomo · tomo slice 20/39.0]
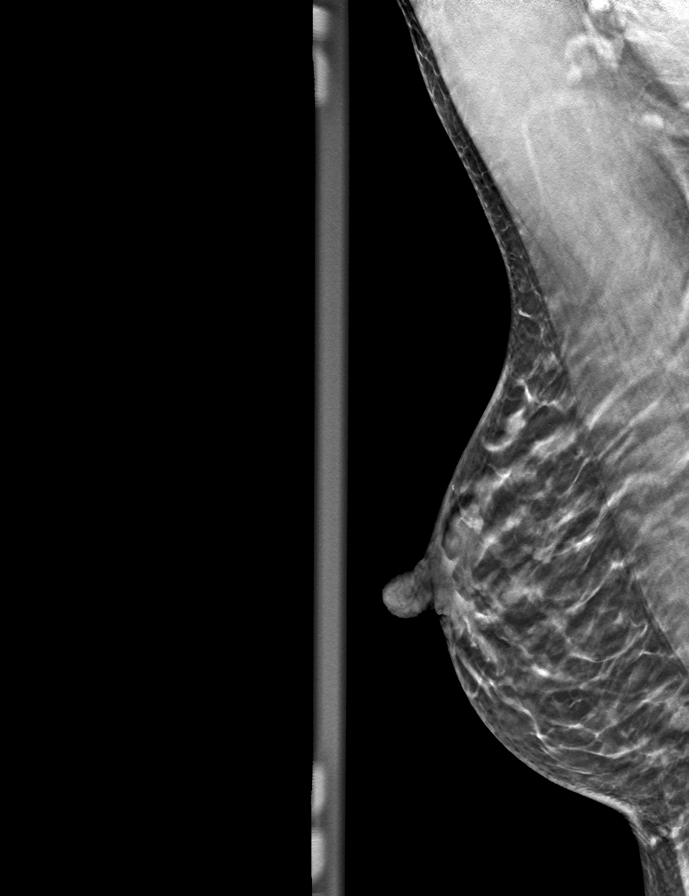

[9 of 24 positions shown; findings below may reference images not displayed]

ACR Breast Density Category b: There are scattered areas of
fibroglandular density.
FINDINGS: In the right breast 2 asymmetries require further evaluation.

In the left breast a focal asymmetry requires further evaluation.
IMPRESSION: Further evaluation is suggested for possible asymmetry in the right
breast.

Further evaluation is suggested for possible focal asymmetry in the
left breast.

RECOMMENDATION:
Diagnostic mammogram and possibly ultrasound of both breasts.
(Code:33-I-XX6)

The patient will be contacted regarding the findings, and additional
imaging will be scheduled.

BI-RADS CATEGORY  0: Incomplete. Need additional imaging evaluation
and/or prior mammograms for comparison.
